# Patient Record
Sex: Female | Born: 1995 | Race: White | Hispanic: No | Marital: Single
Health system: Southern US, Community
[De-identification: ages and names within clinical notes are randomized; demographics above are authoritative.]

---

## 2013-04-01 ENCOUNTER — Emergency Department: Payer: Self-pay | Admitting: Emergency Medicine

## 2013-04-01 LAB — COMPREHENSIVE METABOLIC PANEL
ALK PHOS: 86 U/L
ALT: 16 U/L (ref 12–78)
Albumin: 3.1 g/dL — ABNORMAL LOW (ref 3.8–5.6)
Anion Gap: 7 (ref 7–16)
BUN: 7 mg/dL (ref 7–18)
Bilirubin,Total: 0.5 mg/dL (ref 0.2–1.0)
CALCIUM: 8.7 mg/dL — AB (ref 9.0–10.7)
CHLORIDE: 107 mmol/L (ref 98–107)
CREATININE: 0.44 mg/dL — AB (ref 0.60–1.30)
Co2: 23 mmol/L (ref 21–32)
EGFR (African American): >60
EGFR (Non-African Amer.): >60
Glucose: 78 mg/dL (ref 65–99)
Osmolality: 271 (ref 275–301)
Potassium: 3.9 mmol/L (ref 3.5–5.1)
SGOT(AST): 21 U/L (ref 0–26)
Sodium: 137 mmol/L (ref 136–145)
Total Protein: 6.8 g/dL (ref 6.4–8.6)

## 2013-04-01 LAB — URINALYSIS, COMPLETE
Bilirubin,UR: NEGATIVE
GLUCOSE, UR: NEGATIVE mg/dL (ref 0–75)
Ketone: NEGATIVE
Leukocyte Esterase: NEGATIVE
Nitrite: POSITIVE
Ph: 7 (ref 4.5–8.0)
Protein: 30
RBC,UR: 11 /HPF (ref 0–5)
Specific Gravity: 1.025 (ref 1.003–1.030)
WBC UR: 2 /HPF (ref 0–5)

## 2013-04-01 LAB — CBC
HCT: 37.2 % (ref 35.0–47.0)
HGB: 13 g/dL (ref 12.0–16.0)
MCH: 30.8 pg (ref 26.0–34.0)
MCHC: 34.9 g/dL (ref 32.0–36.0)
MCV: 88 fL (ref 80–100)
Platelet: 234 10*3/uL (ref 150–440)
RBC: 4.22 10*6/uL (ref 3.80–5.20)
RDW: 12.9 % (ref 11.5–14.5)
WBC: 19.2 10*3/uL — ABNORMAL HIGH (ref 3.6–11.0)

## 2013-04-01 LAB — GC/CHLAMYDIA PROBE AMP

## 2013-04-01 LAB — WET PREP, GENITAL

## 2013-04-01 LAB — HCG, QUANTITATIVE, PREGNANCY: BETA HCG, QUANT.: 18629 m[IU]/mL — AB

## 2013-04-01 LAB — LIPASE, BLOOD: Lipase: 93 U/L (ref 73–393)

## 2013-09-15 ENCOUNTER — Inpatient Hospital Stay: Payer: Self-pay | Admitting: Obstetrics and Gynecology

## 2013-09-15 LAB — PIH PROFILE
ANION GAP: 7 (ref 7–16)
BUN: 7 mg/dL (ref 7–18)
CHLORIDE: 110 mmol/L — AB (ref 98–107)
Calcium, Total: 8.3 mg/dL — ABNORMAL LOW (ref 8.5–10.1)
Co2: 22 mmol/L (ref 21–32)
Creatinine: 0.47 mg/dL — ABNORMAL LOW (ref 0.60–1.30)
EGFR (African American): >60
Glucose: 86 mg/dL (ref 65–99)
HCT: 30.9 % — AB (ref 35.0–47.0)
HGB: 10.1 g/dL — AB (ref 12.0–16.0)
MCH: 29.1 pg (ref 26.0–34.0)
MCHC: 32.8 g/dL (ref 32.0–36.0)
MCV: 89 fL (ref 80–100)
Osmolality: 275 (ref 275–301)
POTASSIUM: 3.9 mmol/L (ref 3.5–5.1)
Platelet: 224 10*3/uL (ref 150–440)
RBC: 3.48 10*6/uL — AB (ref 3.80–5.20)
RDW: 13.7 % (ref 11.5–14.5)
SGOT(AST): 11 U/L — ABNORMAL LOW (ref 15–37)
SODIUM: 139 mmol/L (ref 136–145)
URIC ACID: 4.4 mg/dL (ref 2.6–6.0)
WBC: 14.8 10*3/uL — AB (ref 3.6–11.0)

## 2013-09-15 LAB — CBC WITH DIFFERENTIAL/PLATELET
BASOS PCT: 0.1 %
Basophil #: 0 10*3/uL (ref 0.0–0.1)
EOS PCT: 0 %
Eosinophil #: 0 10*3/uL (ref 0.0–0.7)
HCT: 30.8 % — AB (ref 35.0–47.0)
HGB: 10.4 g/dL — ABNORMAL LOW (ref 12.0–16.0)
LYMPHS ABS: 1.7 10*3/uL (ref 1.0–3.6)
LYMPHS PCT: 7.5 %
MCH: 29.3 pg (ref 26.0–34.0)
MCHC: 33.7 g/dL (ref 32.0–36.0)
MCV: 87 fL (ref 80–100)
Monocyte #: 1 x10 3/mm — ABNORMAL HIGH (ref 0.2–0.9)
Monocyte %: 4.5 %
NEUTROS ABS: 19.8 10*3/uL — AB (ref 1.4–6.5)
NEUTROS PCT: 87.9 %
Platelet: 223 10*3/uL (ref 150–440)
RBC: 3.54 10*6/uL — ABNORMAL LOW (ref 3.80–5.20)
RDW: 13.8 % (ref 11.5–14.5)
WBC: 22.5 10*3/uL — ABNORMAL HIGH (ref 3.6–11.0)

## 2013-09-15 LAB — GC/CHLAMYDIA PROBE AMP

## 2013-09-16 LAB — CBC WITH DIFFERENTIAL/PLATELET
BASOS ABS: 0 10*3/uL (ref 0.0–0.1)
Basophil %: 0.2 %
EOS PCT: 0 %
Eosinophil #: 0 10*3/uL (ref 0.0–0.7)
HCT: 28.5 % — ABNORMAL LOW (ref 35.0–47.0)
HGB: 9.4 g/dL — AB (ref 12.0–16.0)
Lymphocyte #: 1.2 10*3/uL (ref 1.0–3.6)
Lymphocyte %: 4.7 %
MCH: 28.8 pg (ref 26.0–34.0)
MCHC: 32.9 g/dL (ref 32.0–36.0)
MCV: 88 fL (ref 80–100)
MONOS PCT: 4.5 %
Monocyte #: 1.2 x10 3/mm — ABNORMAL HIGH (ref 0.2–0.9)
NEUTROS ABS: 23.6 10*3/uL — AB (ref 1.4–6.5)
Neutrophil %: 90.6 %
PLATELETS: 217 10*3/uL (ref 150–440)
RBC: 3.25 10*6/uL — ABNORMAL LOW (ref 3.80–5.20)
RDW: 13.6 % (ref 11.5–14.5)
WBC: 26 10*3/uL — ABNORMAL HIGH (ref 3.6–11.0)

## 2013-09-17 LAB — HEMATOCRIT: HCT: 24.9 % — AB (ref 35.0–47.0)

## 2013-09-17 LAB — CBC WITH DIFFERENTIAL/PLATELET
Basophil #: 0 10*3/uL (ref 0.0–0.1)
Basophil %: 0.2 %
EOS ABS: 0.1 10*3/uL (ref 0.0–0.7)
Eosinophil %: 0.4 %
HGB: 8 g/dL — AB (ref 12.0–16.0)
LYMPHS ABS: 2.4 10*3/uL (ref 1.0–3.6)
Lymphocyte %: 15.2 %
MCH: 28.8 pg (ref 26.0–34.0)
MCHC: 32.1 g/dL (ref 32.0–36.0)
MCV: 90 fL (ref 80–100)
MONO ABS: 1.2 x10 3/mm — AB (ref 0.2–0.9)
Monocyte %: 7.8 %
NEUTROS ABS: 11.9 10*3/uL — AB (ref 1.4–6.5)
Neutrophil %: 76.4 %
PLATELETS: 188 10*3/uL (ref 150–440)
RBC: 2.78 10*6/uL — ABNORMAL LOW (ref 3.80–5.20)
RDW: 13.7 % (ref 11.5–14.5)
WBC: 15.5 10*3/uL — ABNORMAL HIGH (ref 3.6–11.0)

## 2013-09-23 ENCOUNTER — Inpatient Hospital Stay: Payer: Self-pay | Admitting: Internal Medicine

## 2013-09-23 LAB — CBC WITH DIFFERENTIAL/PLATELET
BASOS PCT: 0.2 %
Basophil #: 0 10*3/uL (ref 0.0–0.1)
EOS PCT: 0.6 %
Eosinophil #: 0.1 10*3/uL (ref 0.0–0.7)
HCT: 29.4 % — ABNORMAL LOW (ref 35.0–47.0)
HGB: 9.6 g/dL — ABNORMAL LOW (ref 12.0–16.0)
LYMPHS ABS: 2.2 10*3/uL (ref 1.0–3.6)
Lymphocyte %: 12.4 %
MCH: 28.9 pg (ref 26.0–34.0)
MCHC: 32.5 g/dL (ref 32.0–36.0)
MCV: 89 fL (ref 80–100)
MONO ABS: 1.2 x10 3/mm — AB (ref 0.2–0.9)
Monocyte %: 7.1 %
NEUTROS ABS: 13.9 10*3/uL — AB (ref 1.4–6.5)
Neutrophil %: 79.7 %
PLATELETS: 372 10*3/uL (ref 150–440)
RBC: 3.32 10*6/uL — AB (ref 3.80–5.20)
RDW: 13.5 % (ref 11.5–14.5)
WBC: 17.4 10*3/uL — ABNORMAL HIGH (ref 3.6–11.0)

## 2013-09-23 LAB — URINALYSIS, COMPLETE
Bacteria: NONE SEEN
Bilirubin,UR: NEGATIVE
GLUCOSE, UR: NEGATIVE mg/dL (ref 0–75)
Ketone: NEGATIVE
LEUKOCYTE ESTERASE: NEGATIVE
NITRITE: NEGATIVE
PROTEIN: NEGATIVE
Ph: 7 (ref 4.5–8.0)
RBC,UR: 6 /HPF (ref 0–5)
Specific Gravity: 1.019 (ref 1.003–1.030)
Squamous Epithelial: <1
WBC UR: 6 /HPF (ref 0–5)

## 2013-09-23 LAB — COMPREHENSIVE METABOLIC PANEL
ALT: 20 U/L
ANION GAP: 11 (ref 7–16)
AST: 17 U/L (ref 15–37)
Albumin: 2.7 g/dL — ABNORMAL LOW (ref 3.4–5.0)
Alkaline Phosphatase: 123 U/L — ABNORMAL HIGH
BUN: 11 mg/dL (ref 7–18)
Bilirubin,Total: 0.4 mg/dL (ref 0.2–1.0)
CALCIUM: 9.1 mg/dL (ref 8.5–10.1)
Chloride: 106 mmol/L (ref 98–107)
Co2: 24 mmol/L (ref 21–32)
Creatinine: 0.78 mg/dL (ref 0.60–1.30)
EGFR (African American): >60
EGFR (Non-African Amer.): >60
Glucose: 94 mg/dL (ref 65–99)
OSMOLALITY: 280 (ref 275–301)
POTASSIUM: 3.9 mmol/L (ref 3.5–5.1)
SODIUM: 141 mmol/L (ref 136–145)
Total Protein: 7.4 g/dL (ref 6.4–8.2)

## 2013-09-23 LAB — D-DIMER(ARMC): D-Dimer: 3185 ng/ml

## 2013-09-23 LAB — APTT: ACTIVATED PTT: 31.4 s (ref 23.6–35.9)

## 2013-09-23 LAB — PROTIME-INR
INR: 1
PROTHROMBIN TIME: 13 s (ref 11.5–14.7)

## 2013-09-24 DIAGNOSIS — I369 Nonrheumatic tricuspid valve disorder, unspecified: Secondary | ICD-10-CM

## 2013-09-24 LAB — BASIC METABOLIC PANEL
Anion Gap: 12 (ref 7–16)
BUN: 12 mg/dL (ref 7–18)
CREATININE: 0.8 mg/dL (ref 0.60–1.30)
Calcium, Total: 8.7 mg/dL (ref 8.5–10.1)
Chloride: 105 mmol/L (ref 98–107)
Co2: 25 mmol/L (ref 21–32)
Glucose: 93 mg/dL (ref 65–99)
OSMOLALITY: 283 (ref 275–301)
POTASSIUM: 3.7 mmol/L (ref 3.5–5.1)
SODIUM: 142 mmol/L (ref 136–145)

## 2013-09-24 LAB — HEPARIN LEVEL (UNFRACTIONATED)
ANTI-XA(UNFRACTIONATED): 0.11 [IU]/mL — AB (ref 0.30–0.70)
Anti-Xa(Unfractionated): <0.1 IU/mL — ABNORMAL LOW (ref 0.30–0.70)

## 2013-09-24 LAB — CBC WITH DIFFERENTIAL/PLATELET
BASOS ABS: 0.1 10*3/uL (ref 0.0–0.1)
Basophil %: 0.4 %
EOS PCT: 0.8 %
Eosinophil #: 0.1 10*3/uL (ref 0.0–0.7)
HCT: 29 % — AB (ref 35.0–47.0)
HGB: 9.7 g/dL — AB (ref 12.0–16.0)
Lymphocyte #: 3.3 10*3/uL (ref 1.0–3.6)
Lymphocyte %: 21 %
MCH: 29.3 pg (ref 26.0–34.0)
MCHC: 33.3 g/dL (ref 32.0–36.0)
MCV: 88 fL (ref 80–100)
MONOS PCT: 7.8 %
Monocyte #: 1.2 x10 3/mm — ABNORMAL HIGH (ref 0.2–0.9)
Neutrophil #: 11.1 10*3/uL — ABNORMAL HIGH (ref 1.4–6.5)
Neutrophil %: 70 %
PLATELETS: 377 10*3/uL (ref 150–440)
RBC: 3.3 10*6/uL — ABNORMAL LOW (ref 3.80–5.20)
RDW: 13.5 % (ref 11.5–14.5)
WBC: 15.8 10*3/uL — AB (ref 3.6–11.0)

## 2013-09-25 LAB — PROTIME-INR
INR: 1.1
Prothrombin Time: 14 secs (ref 11.5–14.7)

## 2014-06-08 NOTE — Discharge Summary (Signed)
PATIENT NAME:  Brittany Coffey, Brittany Coffey MR#:  [id number] DATE OF BIRTH:  12-05-1995  DATE OF ADMISSION:  09/23/2013 DATE OF DISCHARGE:  09/25/2013   DISCHARGE DIAGNOSES:   1.  Acute respiratory failure secondary to pulmonary embolus.  2.  The patient had a cesarean section a week ago. 3.  Acute bronchitis.  4.  THE PATIENT IS ALLERGIC TO PENICILLIN AND SULFA.   DISCHARGE MEDICATIONS:  1.  Coumadin 7.5 mg daily.  2.  Lovenox 105 mg subcutaneous q. 12 hours.  3.  Percocet 5/325 every 4 hours as needed for pain.   CONSULTATIONS: None.   HOSPITAL COURSE:  1.  The patient is a 19 year old female with cesarean section a week ago, comes in because of trouble breathing. The patient had acute shortness of breath. A CT chest showed subsegmental PE on the right side. The patient was admitted to the hospitalist service for acute PE, started on a heparin drip only.  The patient's leg ultrasound was negative for DVT and the patient's oxygen saturations were 98% on room air. The patient  was switched to  Lovenox and Coumadin and was discharged to home with Lovenox and Coumadin. The patient has no primary doctor, so the case manager has set the patient up with [facility] for her to go and have the Coumadin checkup for PT/INR. The patient has an appointment [facility] 09/26/2013. The patient will have Lovenox until the INR is therapeutic.  2.  Acute bronchitis. The patient's temperature of 100.5, but she is ALLERGIC TO PENICILLIN AND SULFA and temperature resolved on its own without medication and without antibiotics. The patient is nursing, so I explained that if she continues to have fevers, she can have GYN look at her breasts for possible infection and start antibiotics, but she was not given any antibiotics because the fever subsided on its own and SHE HAS MULTIPLE ALLERGIES.   The patient was given a Lovenox prescription for 7 days. She has Percocet for pain for her cesarean, due to postoperative pain  and  also pleuritic chest pain.   The patient's blood pressure is 149/83, heart rate 84, temperature 98.8, oxygen saturation  93% on room air at discharge.   TIME SPENT: More than 30 minutes.    ____________________________ Katha HammingSnehalatha Arionna Hoggard, MD sk:MT D: 09/27/2013 07:20:00 ET T: 09/27/2013 07:49:36 ET JOB#: [id number]  cc: Katha HammingSnehalatha Lukus Binion, MD, <Dictator> Katha HammingSNEHALATHA Anaclara Acklin MD ELECTRONICALLY SIGNED 10/02/2013 22:56

## 2014-06-08 NOTE — Op Note (Signed)
PATIENT NAME:  Brittany Coffey, Brittany Coffey MR#:  [id number] DATE OF BIRTH:  1995-04-12  DATE OF PROCEDURE:  10/02/2013  PREOPERATIVE DIAGNOSIS: Secondary arrest of dilation, OP presentation.   POSTOPERATIVE DIAGNOSIS: Secondary arrest of dilation, OP presentation.   PROCEDURE: Primary lower uterine transverse cesarean section.   SURGEON: Adria Devonarrie Nazifa Trinka, M.D.   ASSISTANT: Acquanetta BellingAngela Lugiano, CNM  ESTIMATED BLOOD LOSS: 800 mL.   FINDINGS: Term liveborn infant with normal uterus, tubes and ovaries, in straight OP position.   PROCEDURE: The patient was taken to the operating room and placed in supine position. After adequate spinal and epidural anesthesia was instilled, the patient was prepped and draped in the usual sterile fashion. A Pfannenstiel skin incision was made approximately two fingerbreadths above the pubic symphysis and carried sharply down to the fascia. The fascia was nicked in the midline and the incision was extended in a superolateral manner. The Kochers were attached to the fascia and the rectus muscles were sharply and bluntly dissected off the rectus fascia. Muscle bellies were identified in the midline. The midline was split. The peritoneum was grasped and entered. The bladder blade was placed. A bladder flap was created. Uterine incision was made and extended with the surgeon's fingers. The sac was ruptured and the infant's head was delivered. The infant was bulb suctioned. The cord was clamped and cut. Cord blood was obtained and the infant was handed to the awaiting pediatrician. The uterus was exteriorized and wrapped in a moist laparotomy sponge. Pitocin was started. The interior of the uterus was curetted with a moist lap sponge. The uterine incision was closed with a running locked chromic suture and then an imbricating chromic suture. The bladder was tacked back up to the uterine incision with a 3-0 chromic. The abdomen was cleared of clots and irrigated with warm normal saline. The uterus was  placed back into the abdomen. The muscle bellies and the gutters were cleared. The muscle bellies were approximated. On-Q trocars were placed above the superior incision edge. Catheters were threaded, primed and wrapped between the fascia and the muscle. The rectus fascia was closed with a running chromic suture. Monocryl 4-0 was used to approximate the skin edges. A plain gut suture was then used to approximate the subcutaneous fat. Dermabond was placed. Steri-Strips were placed. Dermabond was placed at the trocar site. The fundus was found to be firm. Bandage was placed. The patient was taken to recovery after having tolerated the procedure well with clear urine noted in the Foley bag.    ____________________________ Elliot Gurneyarrie C. Seirra Kos, MD cck:JT D: 10/02/2013 23:22:37 ET T: 10/03/2013 00:06:13 ET JOB#: [id number]  cc: Elliot Gurneyarrie C. Kamdon Reisig, MD, <Dictator>

## 2014-06-08 NOTE — Op Note (Signed)
PATIENT NAME:  Brittany Coffey, Mayerly MR#:  [id number] DATE OF BIRTH:  January 07, 1996  DATE OF PROCEDURE:  09/16/2013  PREOPERATIVE DIAGNOSIS: (Dictation Anomaly) h OP.  POSTOPERATIVE DIAGNOSIS: (Dictation Anomaly) h.   PROCEDURE: (Dictation Anomaly) h cesarean suction.  ESTIMATED BLOOD LOSS: 1000 mL.   (Dictation Anomaly) h infant with Apgar's 8 and 9 (Dictation Anomaly) h  DESCRIPTION OF PROCEDURE: The patient was taken to the operating room. After labor (Dictation Anomaly)  , decision for a cesarean section was made. Timeout was performed. The patient was laid supine on the table after epidural spinal (Dictation Anomaly)  . Incision was started on the patient's abdomen in (Dictation Anomaly)  . Incision was made and carried sharply down to the fascia. The fascia was nicked in the midline. The incision was extended in (Dictation Anomaly)   manner with the curved Mayo scissors (Dictation Anomaly)   rectus fascia and rectus muscle were sharply and bluntly dissected (Dictation Anomaly)   fascia. The midline (Dictation Anomaly)   was identified Education officer, environmental(Dictation Anomaly)  . The bladder blade was placed. The bladder flap was created. (Dictation Anomaly)    incision was made. The infant's mouth was at the incision. The infant's head was delivered, nuchal cord x 1. (Dictation Anomaly)   handed off to the awaiting pediatrician. Pitocin was started. The placenta was delivered spontaneously. The uterus was exteriorized and wrapped in a moist (Dictation Anomaly)   lap sponge (Dictation Anomaly)   incision was closed with a running locked chromic suture and a running embrocating suture. The bladder flap was tacked back up to the uterine incision. The abdomen was cleared of clots (Dictation Anomaly)   was placed into the abdomen. Gutters were cleared of clots. Muscle (Dictation Anomaly)   peritoneum was closed with a running Vicryl suture. On-Q trocars were placed. The abdomen (Dictation Anomaly)   between the muscle belly and the  rectus fascia. The rectus fascia was closed with a running Vicryl suture. The On-Q trocar catheter was primed. Plain gut suture was used to approximate the subcutaneous (Dictation Anomaly)   Monocryl was used to approximate the skin edges.   Steri-Strips and benzoin were placed. (Dictation Anomaly)   was placed on the catheter site. Tegaderm placed with 4 x 4's. Uterine massage found the areas to be firm. (Dictation Anomaly)   The patient was taken to recovery after having tolerated the procedure well.    ____________________________ Elliot Gurneyarrie C. Evalyn Shultis, MD cck:jr D: 09/24/2013 08:13:00 ET T: 09/24/2013 10:47:29 ET JOB#: [id number]  cc: Elliot Gurneyarrie C. Damonta Cossey, MD, <Dictator> Elliot GurneyARRIE C Morley Gaumer MD ELECTRONICALLY SIGNED 09/28/2013 15:27

## 2014-06-08 NOTE — Op Note (Signed)
PATIENT NAME:  Brittany Coffey, Brittany Coffey MR#:  [id number] DATE OF BIRTH:  [birth year]  DATE OF PROCEDURE:  09/16/2013  PREOPERATIVE DIAGNOSIS: Secondary arrest of dilation, OP presentation.   POSTOPERATIVE DIAGNOSIS: Secondary arrest of dilation, OP presentation.   PROCEDURE: Primary lower uterine transverse cesarean section.   SURGEON: Adria Devonarrie Deniesha Stenglein, M.D.   ASSISTANT: Acquanetta BellingAngela Lugiano, CNM  ESTIMATED BLOOD LOSS: 800 mL.   FINDINGS: Term liveborn infant with normal uterus, tubes and ovaries, in straight OP position.   PROCEDURE: The patient was taken to the operating room and placed in supine position. After adequate spinal and epidural anesthesia was instilled, the patient was prepped and draped in the usual sterile fashion. A Pfannenstiel skin incision was made approximately two fingerbreadths above the pubic symphysis and carried sharply down to the fascia. The fascia was nicked in the midline and the incision was extended in a superolateral manner. The Kochers were attached to the fascia and the rectus muscles were sharply and bluntly dissected off the rectus fascia. Muscle bellies were identified in the midline. The midline was split. The peritoneum was grasped and entered. The bladder blade was placed. A bladder flap was created. Uterine incision was made and extended with the surgeon's fingers. The sac was ruptured and the infant's head was delivered. The infant was bulb suctioned. The cord was clamped and cut. Cord blood was obtained and the infant was handed to the awaiting pediatrician. The uterus was exteriorized and wrapped in a moist laparotomy sponge. Pitocin was started. The interior of the uterus was curetted with a moist lap sponge. The uterine incision was closed with a running locked chromic suture and then an imbricating chromic suture. The bladder was tacked back up to the uterine incision with a 3-0 chromic. The abdomen was cleared of clots and irrigated with warm normal saline. The uterus was  placed back into the abdomen. The muscle bellies and the gutters were cleared. The muscle bellies were approximated. On-Q trocars were placed above the superior incision edge. Catheters were threaded, primed and wrapped between the fascia and the muscle. The rectus fascia was closed with a running chromic suture. Monocryl 4-0 was used to approximate the skin edges. A plain gut suture was then used to approximate the subcutaneous fat. Dermabond was placed. Steri-Strips were placed. Dermabond was placed at the trocar site. The fundus was found to be firm. Bandage was placed. The patient was taken to recovery after having tolerated the procedure well with clear urine noted in the Foley bag.    ____________________________ Elliot Gurneyarrie C. Eli Adami, MD cck:JT D: 10/02/2013 23:22:00 ET T: 10/03/2013 00:06:13 ET JOB#: [id number]  cc: Elliot Gurneyarrie C. Javius Sylla, MD, <Dictator> Elliot GurneyARRIE C Marbeth Smedley MD ELECTRONICALLY SIGNED 10/06/2013 17:39

## 2014-06-08 NOTE — H&P (Signed)
PATIENT NAME:  Brittany Coffey, Brittany Coffey MR#:  [id number] DATE OF BIRTH:  Jun 25, 1995  DATE OF ADMISSION:  09/23/2013  PRIMARY CARE PHYSICIAN: Nonlocal.  HISTORY OF PRESENT ILLNESS: This is a 19 year old Caucasian female who has a past medical history only of hypertension during pregnancy who presents 1 week after cesarean section for delivery of her baby with acute shortness of breath. She was found on CT of chest scan in our ED to have a right subsegmental pulmonary embolism. Gynecology was contacted by ED physician and they agreed with anticoagulating her to treat her pulmonary embolus. Hospitalist services were contacted at this time for admission.  PAST MEDICAL HISTORY: Hypertension during pregnancy.  FAMILY HISTORY: No family history of DVTs or clots.   PAST SURGICAL HISTORY: Cesarean section 1 week ago.  ALLERGIES: PENICILLIN, SULFA DRUGS.   MEDICATIONS: No current home medications.  REVIEW OF SYSTEMS: CONSTITUTIONAL: Denies fever, denies chills. Denies malaise. EYES: No blurred vision. No double vision. No cataracts. EARS, NOSE, AND THROAT: No tinnitus, no ear pain. No sore throat. CARDIOVASCULAR: No palpitations. No chest pain. No lower extremity edema. RESPIRATORY: Positive for shortness of breath. No cough. No wheeze, no hemoptysis. GASTROINTESTINAL: No nausea, no vomiting, no diarrhea. No abdominal pain. GENITOURINARY: No dysuria, no hematuria, no frequency. ENDOCRINE: No polyuria. No heat or cold intolerance. SKIN: No acne, rash, or lesion. MUSCULOSKELETAL: No arthritis, cramps, or swelling. NEUROLOGIC: No focal weakness. No ataxia, no dysarthria. PSYCHIATRIC: No anxiety. No depression.  PHYSICAL EXAMINATION: VITALS SIGNS: Temperature 98.6, pulse 90, respirations 20, pulse oximetry 98% on room air. Blood pressure 152/75.  GENERAL: This is a pleasant young female lying in bed in no apparent distress. HEENT: Normocephalic, atraumatic. Pupils are equal, round, and reactive to light.  Extraocular movements intact. NECK: Supple. No JVD, no cervical lymphadenopathy. RESPIRATORY: Lungs clear to auscultation bilaterally with no wheezes, rales, or rhonchi. CARDIOVASCULAR: Regular rate, S1, S2 present. No murmurs, rubs, or gallops. No peripheral edema. ABDOMEN: Soft, nontender, nondistended. Good bowel sounds. Cesarean section is clean, dry, and intact. Steri-Strips in place. Seems to be healing well with no signs of infection.  MUSCULOSKELETAL: Full range of motion throughout. Spontaneously moves all extremities. SKIN: Warm, dry, and intact. No rashes or lesions noted aside from her cesarean incision. NEUROLOGIC: Cranial nerves grossly intact. No focal, motor, or sensory deficit. PSYCHIATRIC: Patient is awake, alert, and oriented. Mood and affect are appropriate.  LABORATORY DATA: White count 17.4, hemoglobin 9.6, hematocrit 29.4, platelet count 372, sodium 141, potassium 3.9, chloride 106, bicarbonate 24, creatinine 0.78, BUN 11, glucose 94, anion gap 11, calcium 9.1, total protein 7.4, albumin 2.7, total bilirubin 0.4, alkaline phosphatase 123, AST 17, ALT 20, INR 1, D-dimer 3185. Urinalysis showed a specific gravity of 1.019 and 2+ plus blood. No protein, no nitrites, no leukocyte esterase.   CT of the chest showed subsegmental pulmonary embolism within a branch of the right lower lobe pulmonary artery with an overall small clot burden.  ASSESSMENT AND PLAN: This is a 19 year old Caucasian female with a past medical history only of hypertension during pregnancy now status post cesarean section delivery of her infant 1 week ago presenting today with shortness of breath found in the ED to have acute pulmonary embolus. 1. Pulmonary embolus. CT of chest showed right subsegmental pulmonary embolus. Heparin drip started in the Emergency Department. Gynecology was contacted and they agreed with this plan. We will consider transition to Lovenox, possibly as early as tomorrow and then she  will need to be started on some  form of oral anticoagulation. 2. Postop from cesarean section. Her incision is clean, dry, and healing well with no signs of infection. We will provide appropriate pain management. We will provide Colace to prevent constipation.  CODE STATUS: Full code.  TIME SPENT: 45 minutes.    ____________________________ Candace Cruiseavid F. Anne HahnWillis, MD dfw:lt D: 09/23/2013 19:34:07 ET T: 09/23/2013 20:41:42 ET JOB#: [id number]  cc: Candace Cruiseavid F. Anne HahnWillis, MD, <Dictator> Gevin Perea Scotty CourtF Deantae Shackleton MD ELECTRONICALLY SIGNED 10/23/2013 9:43

## 2014-06-25 NOTE — H&P (Signed)
L&D Evaluation:  History:  HPI 19 y/o G2P1001 @ 39wks EDC 09/22/13. Here for IOL Pregnancy induced hypertension on labetalol 200mg  tid, obesity, smoker 5 cigs [place]. Care @ [facility] occasional contractions, denies leaking fluid or vaginal bleeding, baby is active, GBS negative.   Presents with IOL   Patient's Medical History No Chronic Illness   Patient's Surgical History none   Medications Pre Natal Vitamins  labetalol 200mg  tid   Allergies NKDA   Social History tobacco  5cigs [place]   Family History Non-Contributory   ROS:  ROS All systems were reviewed.  HEENT, CNS, GI, GU, Respiratory, CV, Renal and Musculoskeletal systems were found to be normal.   Exam:  Vital Signs stable   Urine Protein not completed   General no apparent distress   Mental Status clear   Chest clear   Heart normal sinus rhythm   Abdomen gravid, non-tender   Estimated Fetal Weight Average for gestational age   Fetal Position vtx   Fundal Height term   Back no CVAT   Edema 2+  pedal   Reflexes 2+   Clonus negative   Pelvic no external lesions, 3cm 50% cx posterior to the left small show   Mebranes Ruptured, AROM   Description clear   FHT normal rate with no decels, baseline 130's 140's avg variability with accels   Ucx irregular   Skin dry   Lymph no lymphadenopathy   Impression:  Impression IOL PIH   Plan:  Plan monitor contractions and for cervical change, monitor BP, PIH panel   Comments Admitted, knows what to expect 2nd baby. Await labs. AROM, may begin pitocin augment. May request IV pain meds, unsure about epidural. Family at bedside supportive.   Electronic Signatures: Albertina ParrLugiano, Sammy Cassar B (CNM)  (Signed 01-Aug-15 09:42)  Authored: L&D Evaluation   Last Updated: 01-Aug-15 09:42 by Albertina ParrLugiano, Shantil Vallejo B (CNM)

## 2015-10-11 IMAGING — US US EXTREM LOW VENOUS BILAT
1 series · 13 of 24 positions shown · non-contrast
Comparison: None.

CLINICAL DATA: Swelling, shortness of breath



[Series 1: us extrem low venous bilat · 0.07mm/px · 13 of 69 slices shown]
[im 1/69]
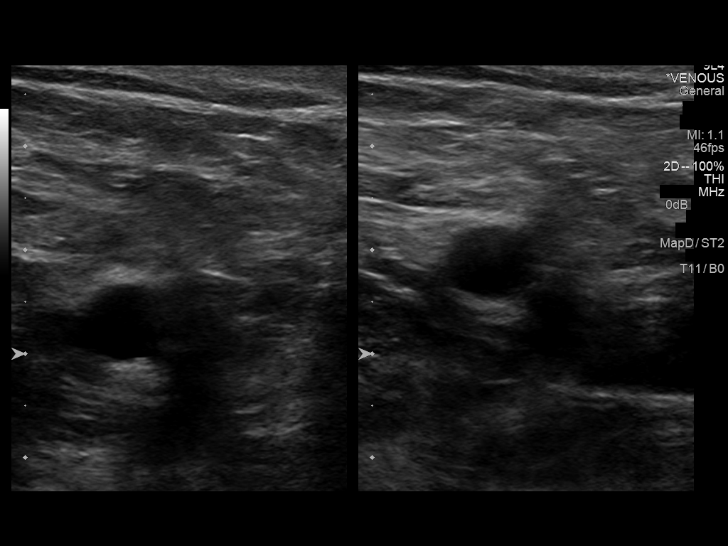
[im 6/69]
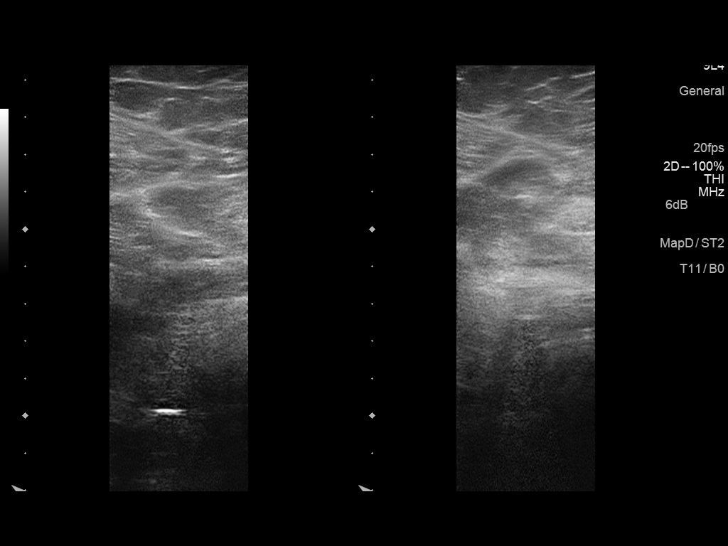
[im 12/69]
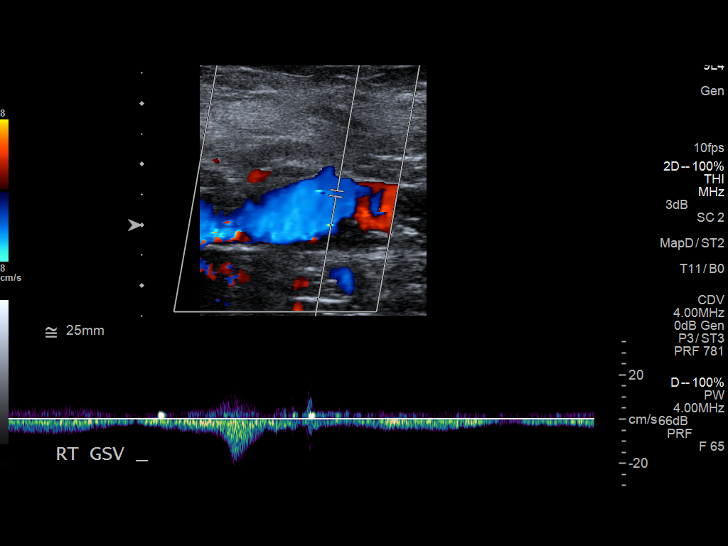
[im 18/69]
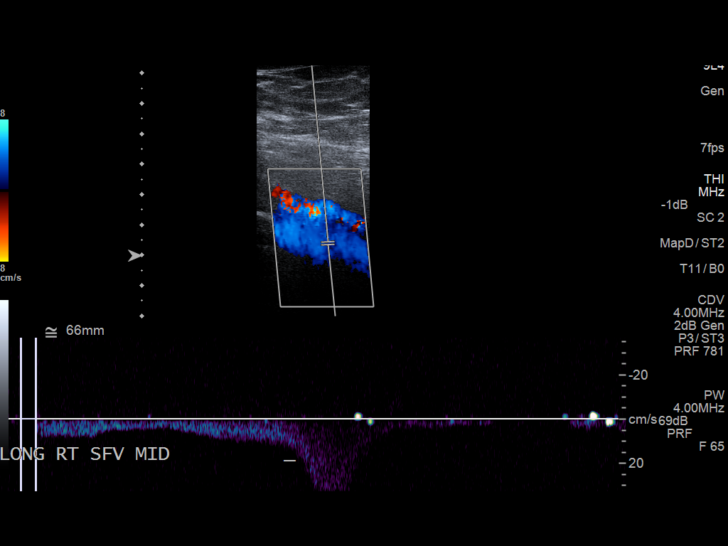
[im 24/69]
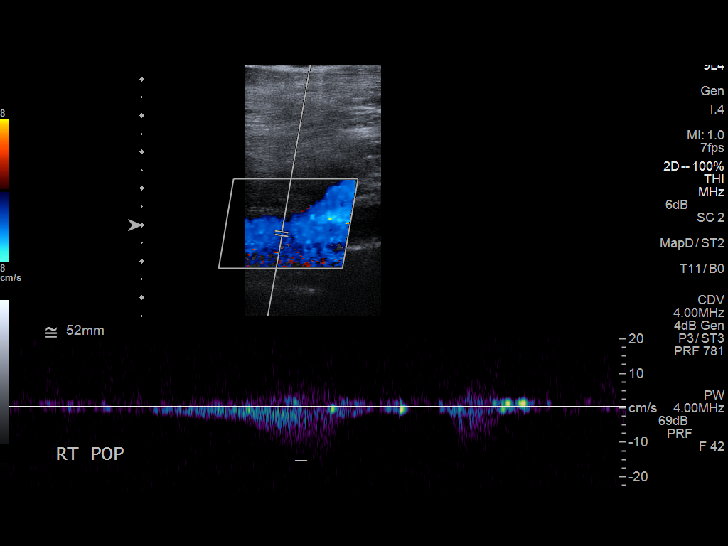
[im 30/69]
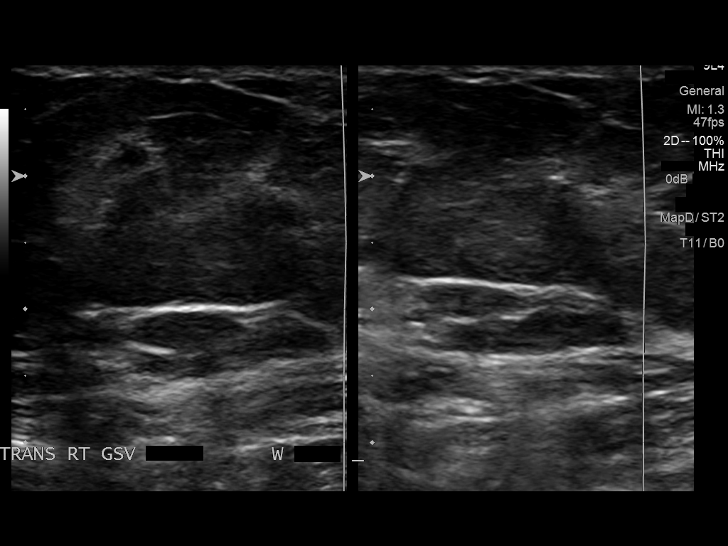
[im 36/69]
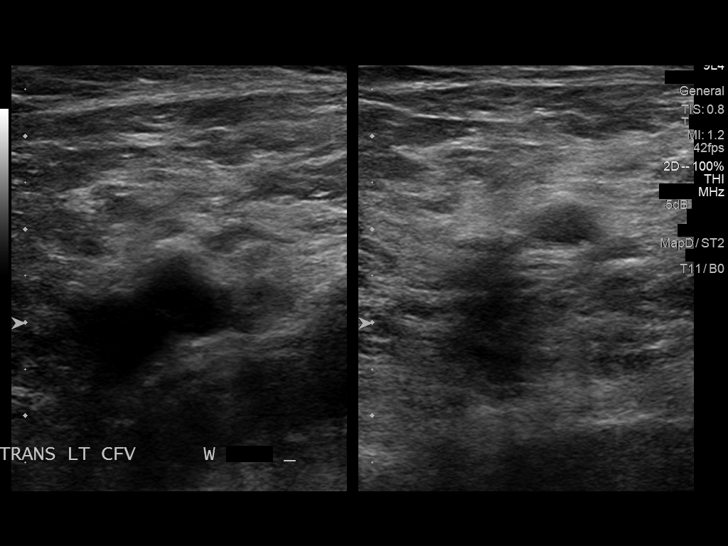
[im 39/69]
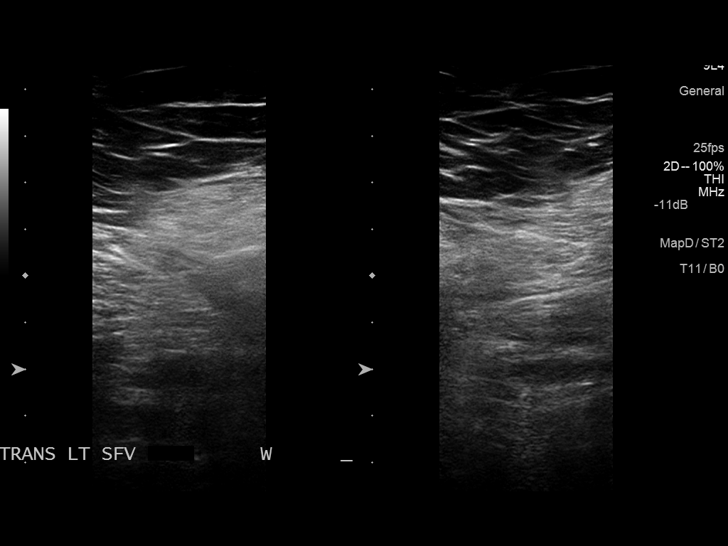
[im 45/69]
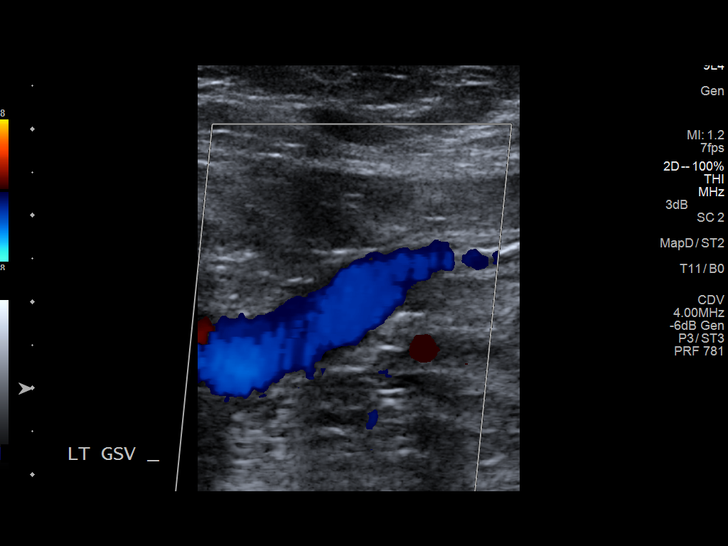
[im 51/69]
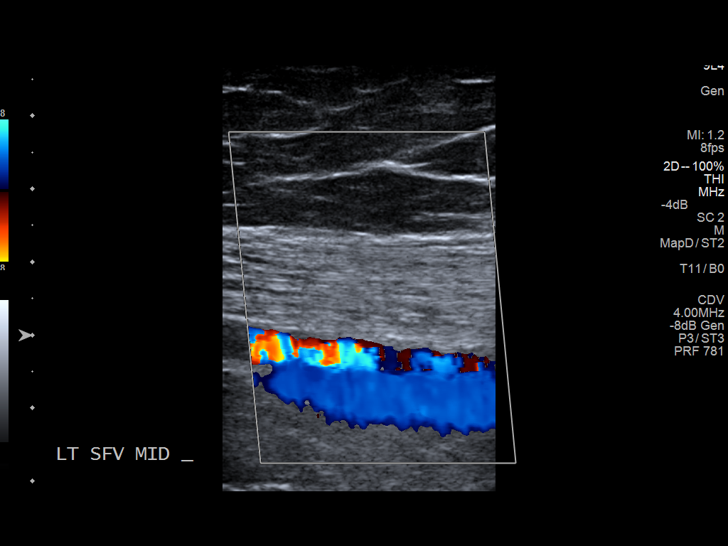
[im 57/69]
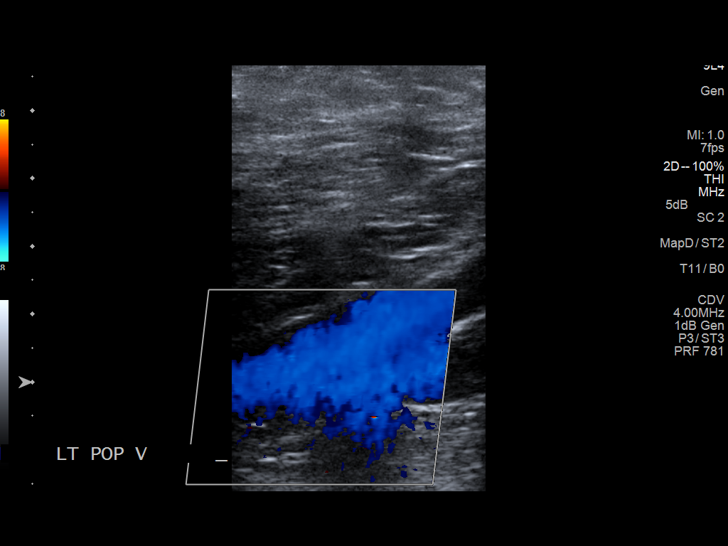
[im 63/69]
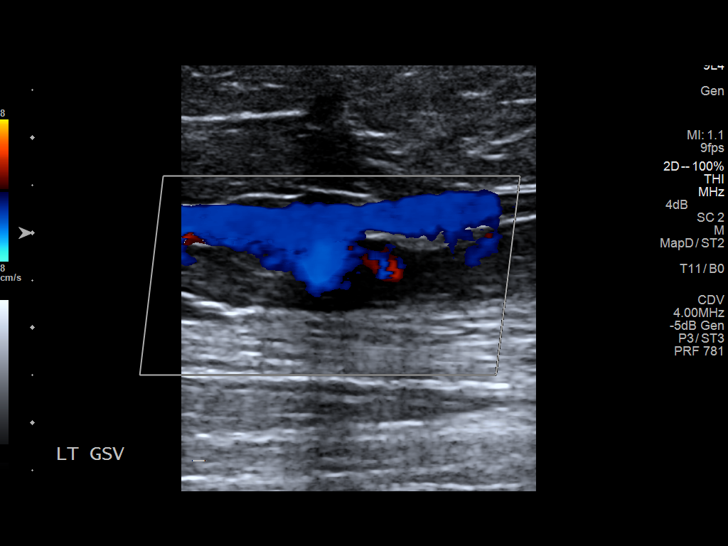
[im 69/69]
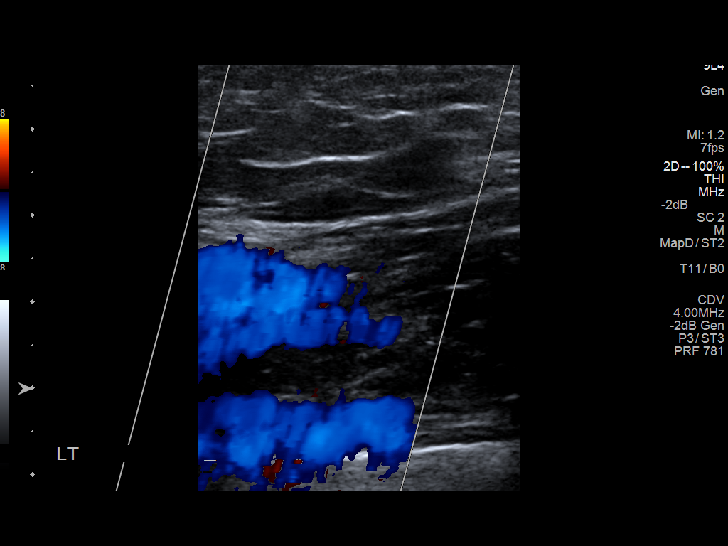

[13 of 24 positions shown; findings below may reference images not displayed]

FINDINGS: Limited exam because of patient body habitus. Difficult to exclude
small nonocclusive DVT.

RIGHT LOWER EXTREMITY

Common Femoral Vein: No evidence of thrombus. Normal
compressibility, respiratory phasicity and response to augmentation.

Saphenofemoral Junction: No evidence of thrombus. Normal
compressibility and flow on color Doppler imaging.

Profunda Femoral Vein: No evidence of thrombus. Normal
compressibility and flow on color Doppler imaging.

Femoral Vein: No evidence of thrombus. Normal compressibility,
respiratory phasicity and response to augmentation.

Popliteal Vein: No evidence of thrombus. Normal compressibility,
respiratory phasicity and response to augmentation.

Calf Veins: No evidence of thrombus. Normal compressibility and flow
on color Doppler imaging.

Superficial Great Saphenous Vein: No evidence of thrombus. Normal
compressibility and flow on color Doppler imaging.

Venous Reflux:  None.

Other Findings:  None.

LEFT LOWER EXTREMITY

Common Femoral Vein: No evidence of thrombus. Normal
compressibility, respiratory phasicity and response to augmentation.

Saphenofemoral Junction: No evidence of thrombus. Normal
compressibility and flow on color Doppler imaging.

Profunda Femoral Vein: No evidence of thrombus. Normal
compressibility and flow on color Doppler imaging.

Femoral Vein: No evidence of thrombus. Normal compressibility,
respiratory phasicity and response to augmentation.

Popliteal Vein: No evidence of thrombus. Normal compressibility,
respiratory phasicity and response to augmentation.

Calf Veins: No evidence of thrombus. Normal compressibility and flow
on color Doppler imaging.

Superficial Great Saphenous Vein: No evidence of thrombus. Normal
compressibility and flow on color Doppler imaging.

Venous Reflux:  None.

Other Findings:  None.
IMPRESSION: Negative for significant occlusive DVT. Limited assessment for small
nonocclusive thrombus.

## 2015-10-31 ENCOUNTER — Emergency Department: Payer: Medicaid Other

## 2015-10-31 ENCOUNTER — Emergency Department
Admission: EM | Admit: 2015-10-31 | Discharge: 2015-10-31 | Disposition: A | Discharge status: Home / Self Care | Payer: Medicaid Other | Attending: Emergency Medicine | Admitting: Emergency Medicine

## 2015-10-31 ENCOUNTER — Encounter: Payer: Self-pay | Admitting: Emergency Medicine

## 2015-10-31 DIAGNOSIS — Y9389 Activity, other specified: Secondary | ICD-10-CM | POA: Diagnosis not present

## 2015-10-31 DIAGNOSIS — S93402A Sprain of unspecified ligament of left ankle, initial encounter: Secondary | ICD-10-CM | POA: Diagnosis not present

## 2015-10-31 DIAGNOSIS — S99912A Unspecified injury of left ankle, initial encounter: Secondary | ICD-10-CM | POA: Diagnosis present

## 2015-10-31 DIAGNOSIS — Y990 Civilian activity done for income or pay: Secondary | ICD-10-CM | POA: Diagnosis not present

## 2015-10-31 DIAGNOSIS — W1839XA Other fall on same level, initial encounter: Secondary | ICD-10-CM | POA: Diagnosis not present

## 2015-10-31 DIAGNOSIS — Y9259 Other trade areas as the place of occurrence of the external cause: Secondary | ICD-10-CM | POA: Diagnosis not present

## 2015-10-31 MED ORDER — NAPROXEN 500 MG PO TABS: 500.0000 mg | ORAL_TABLET | Freq: Two times a day (BID) | ORAL | 0 refills | Status: AC

## 2015-10-31 MED ORDER — KETOROLAC TROMETHAMINE 30 MG/ML IJ SOLN
30.0000 mg | Freq: Once | INTRAMUSCULAR | Status: AC
  Administered 2015-10-31: 30 mg via INTRAMUSCULAR
  Filled 2015-10-31: qty 1

## 2015-10-31 NOTE — ED Notes (Signed)
Reviewed d/c instructions, follow-up care, use of ice/elevation and prescription with pt. Pt verbalized understanding
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
## 2015-10-31 NOTE — Discharge Instructions (Signed)
1. You may take pain medicine as needed (Naprosyn). 2. Apply ice to affected area and elevate leg whenever possible. 3. You may use crutches to walk. 4. Return to the ER for worsening symptoms, persistent vomiting, difficulty breathing or other concerns.
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
